# Patient Record
Sex: Female | Born: 1990 | Race: White | Hispanic: No | Marital: Married | Smoking: Former smoker
Health system: Southern US, Community
[De-identification: ages and names within clinical notes are randomized; demographics above are authoritative.]

## PROBLEM LIST (undated history)

## (undated) DIAGNOSIS — E282 Polycystic ovarian syndrome: Secondary | ICD-10-CM

## (undated) DIAGNOSIS — F1910 Other psychoactive substance abuse, uncomplicated: Secondary | ICD-10-CM

---

## 2015-02-19 ENCOUNTER — Encounter (HOSPITAL_COMMUNITY): Payer: Self-pay | Admitting: *Deleted

## 2015-02-19 ENCOUNTER — Emergency Department (HOSPITAL_COMMUNITY)
Admission: EM | Admit: 2015-02-19 | Discharge: 2015-02-20 | Disposition: A | Discharge status: Home / Self Care | Payer: BLUE CROSS/BLUE SHIELD | Attending: Emergency Medicine | Admitting: Emergency Medicine

## 2015-02-19 DIAGNOSIS — Y939 Activity, unspecified: Secondary | ICD-10-CM | POA: Diagnosis not present

## 2015-02-19 DIAGNOSIS — T7840XA Allergy, unspecified, initial encounter: Secondary | ICD-10-CM

## 2015-02-19 DIAGNOSIS — X58XXXA Exposure to other specified factors, initial encounter: Secondary | ICD-10-CM | POA: Insufficient documentation

## 2015-02-19 DIAGNOSIS — Y999 Unspecified external cause status: Secondary | ICD-10-CM | POA: Diagnosis not present

## 2015-02-19 DIAGNOSIS — Z8742 Personal history of other diseases of the female genital tract: Secondary | ICD-10-CM | POA: Insufficient documentation

## 2015-02-19 DIAGNOSIS — Y929 Unspecified place or not applicable: Secondary | ICD-10-CM | POA: Diagnosis not present

## 2015-02-19 DIAGNOSIS — Z720 Tobacco use: Secondary | ICD-10-CM | POA: Diagnosis not present

## 2015-02-19 DIAGNOSIS — Z79899 Other long term (current) drug therapy: Secondary | ICD-10-CM | POA: Insufficient documentation

## 2015-02-19 HISTORY — DX: Polycystic ovarian syndrome: E28.2

## 2015-02-19 HISTORY — DX: Other psychoactive substance abuse, uncomplicated: F19.10

## 2015-02-19 MED ORDER — METHYLPREDNISOLONE SODIUM SUCC 125 MG IJ SOLR
125.0000 mg | Freq: Once | INTRAMUSCULAR | Status: AC
  Administered 2015-02-19: 125 mg via INTRAVENOUS
  Filled 2015-02-19: qty 2

## 2015-02-19 MED ORDER — SODIUM CHLORIDE 0.9 % IV SOLN
INTRAVENOUS | Status: DC
  Administered 2015-02-19: via INTRAVENOUS

## 2015-02-19 MED ORDER — DIPHENHYDRAMINE HCL 50 MG/ML IJ SOLN
25.0000 mg | Freq: Once | INTRAMUSCULAR | Status: AC
  Administered 2015-02-19: 25 mg via INTRAVENOUS
  Filled 2015-02-19: qty 1

## 2015-02-19 MED ORDER — FAMOTIDINE IN NACL 20-0.9 MG/50ML-% IV SOLN
INTRAVENOUS | Status: AC
  Administered 2015-02-19: 20 mg
  Filled 2015-02-19: qty 50

## 2015-02-19 NOTE — ED Notes (Signed)
Pt is a patient at [facility]; pt states that she got sunburnt on 7/4 to her lower legs and that they have been red and itching and peeling; pt states that yesterday she began to have hives and itching to thighs, trunks, arms and neck; pt has been taking Vistaril, Hydrocortisone cream and Pepcid at [facility]; pt was started on Cipro today

## 2015-02-19 NOTE — ED Provider Notes (Signed)
History  This chart was scribed for non-physician practitioner, Francee Piccolo, PA-C,working with Alvira Monday, MD, by Karle Plumber, ED Scribe. This patient was seen in room WTR6/WTR6 and the patient's care was started at 11:00 PM.  Chief Complaint  Patient presents with  . Urticaria   The history is provided by the patient and medical records. No language interpreter was used.    HPI Comments:  Arleth Mccullar is a 24 y.o. female from [place] who presents to the Emergency Department complaining of a rash to the BLE, BUE, chest and hands that began last night. She states she got sunburned two weeks ago and now reports redness, itching and peeling of the areas. Pt also reports one episode of emesis last night. She states she began taking Vistaril for itching associated with the sunburn for the past 5 days. She has been taking Benadryl 50 mg (about 5 hours ago) and applying hydrocortisone cream for her symptoms with minimal relief. She has also taken Ibuprofen 600 mg. Pt was started on Cipro earlier today for treatment of a UTI. Pt denies any new creams or lotions but states the detergent that her clothes have been washed in is whatever [facility] uses. She denies modifying factors. She denies inability to swallow, SOB, CP, swelling of the tongue or lips, nausea, fever or chills.   Past Medical History  Diagnosis Date  . PCOS (polycystic ovarian syndrome)   . Polysubstance abuse    History reviewed. No pertinent past surgical history. No family history on file. History  Substance Use Topics  . Smoking status: Current Some Day Smoker  . Smokeless tobacco: Not on file  . Alcohol Use: No   OB History    No data available     Review of Systems  Constitutional: Negative for fever and chills.  HENT: Negative for trouble swallowing.   Respiratory: Negative for shortness of breath.   Cardiovascular: Negative for chest pain.  Gastrointestinal: Positive for vomiting  (one episode yesterday). Negative for nausea.  Skin: Positive for color change, rash and wound.  All other systems reviewed and are negative.   Allergies  Macrolides and ketolides  Home Medications   Prior to Admission medications   Medication Sig Start Date End Date Taking? Authorizing Provider  albuterol (PROVENTIL HFA;VENTOLIN HFA) 108 (90 BASE) MCG/ACT inhaler Inhale 2 puffs into the lungs every 6 (six) hours as needed for wheezing or shortness of breath.   Yes Historical Provider, MD  buPROPion (WELLBUTRIN XL) 300 MG 24 hr tablet Take 300 mg by mouth daily. 01/21/15  Yes Historical Provider, MD  Calcium & Magnesium Carbonates (MYLANTA PO) Take 10-20 mLs by mouth as needed (indigestion).   Yes Historical Provider, MD  cetirizine (ZYRTEC) 10 MG tablet Take 10 mg by mouth daily.   Yes Historical Provider, MD  ciprofloxacin (CIPRO) 500 MG tablet Take 500 mg by mouth 2 (two) times daily. For 5 days 02/19/15 02/23/15 Yes Historical Provider, MD  diazepam (VALIUM) 10 MG tablet Take 10 mg by mouth as needed (incase of seizure).   Yes Historical Provider, MD  diphenhydrAMINE (BENADRYL) 25 MG tablet Take 25-50 mg by mouth every 6 (six) hours as needed for allergies.   Yes Historical Provider, MD  hydrocortisone cream 1 % Apply 1 application topically as needed for itching.   Yes Historical Provider, MD  hydrOXYzine (VISTARIL) 25 MG capsule Take 25 mg by mouth 3 (three) times daily as needed for anxiety or itching.   Yes Historical Provider, MD  ibuprofen (ADVIL,MOTRIN) 600 MG tablet Take 600 mg by mouth every 6 (six) hours as needed for moderate pain.   Yes Historical Provider, MD  levothyroxine (SYNTHROID, LEVOTHROID) 50 MCG tablet Take 50 mcg by mouth daily. 01/21/15  Yes Historical Provider, MD  metFORMIN (GLUCOPHAGE-XR) 500 MG 24 hr tablet Take 2,000 mg by mouth every morning. 01/21/15  Yes Historical Provider, MD  Multiple Vitamin (MULTIVITAMIN WITH MINERALS) TABS tablet Take 1 tablet by mouth  daily.   Yes Historical Provider, MD  naltrexone (DEPADE) 50 MG tablet Take 50 mg by mouth daily.   Yes Historical Provider, MD  omeprazole (PRILOSEC) 20 MG capsule Take 20 mg by mouth daily.   Yes Historical Provider, MD  ondansetron (ZOFRAN) 8 MG tablet Take 8 mg by mouth 2 (two) times daily as needed for nausea or vomiting.   Yes Historical Provider, MD  sertraline (ZOLOFT) 50 MG tablet Take 50 mg by mouth daily. 01/21/15  Yes Historical Provider, MD  thiamine (B-1) 100 MG/ML injection Inject 100 mg into the vein once.   Yes Historical Provider, MD  thiamine (VITAMIN B-1) 100 MG tablet Take 100 mg by mouth daily.   Yes Historical Provider, MD   Triage Vitals: BP 142/87 mmHg  Pulse 96  Temp(Src) 99.1 F (37.3 C) (Oral)  Resp 20  SpO2 100%  LMP 01/22/2015 Physical Exam  Constitutional: She is oriented to person, place, and time. She appears well-developed and well-nourished. No distress.  HENT:  Head: Normocephalic and atraumatic.  Right Ear: External ear normal.  Left Ear: External ear normal.  Nose: Nose normal.  Mouth/Throat: Oropharynx is clear and moist.  No intraoral swelling. No angioedema.no intraoral skin sloughing.  Eyes: Conjunctivae are normal.  Neck: Normal range of motion. Neck supple.  No nuchal rigidity.   Cardiovascular: Normal rate, regular rhythm and normal heart sounds.   Pulmonary/Chest: Effort normal.  Abdominal: Soft.  Musculoskeletal: Normal range of motion.  Neurological: She is alert and oriented to person, place, and time.  Skin: Skin is warm and dry. Rash (urticaria noted to BLE, BUE, chest, trunk, scalp) noted. She is not diaphoretic. There is erythema (erythema with dry skin noted lower extremities bilterally, distinct line noted consistent with short tan line from sunburn).  No skin sloughing  Psychiatric: She has a normal mood and affect.  Nursing note and vitals reviewed.   ED Course  Procedures (including critical care time) Medications   diphenhydrAMINE (BENADRYL) injection 25 mg (not administered)  methylPREDNISolone sodium succinate (SOLU-MEDROL) 125 mg/2 mL injection 125 mg (not administered)  famotidine (PEPCID) 20-0.9 MG/50ML-% IVPB (not administered)    DIAGNOSTIC STUDIES: Oxygen Saturation is 100% on RA, normal by my interpretation.   COORDINATION OF CARE: 11:05 PM- Will order IV Pepcid, Benadryl and steroids. Pt verbalizes understanding and agrees to plan.  Medications - No data to display  Labs Review Labs Reviewed - No data to display  Imaging Review No results found.   EKG Interpretation None      12:40 AM On re-evaluation rash improving, palmar surface of hands significantly improved, erythema resolving. No symptoms of SOB, wheezing, vomiting, abdominal pain, diarrhea, throat tightness, angioedema note. Patient is upset stating "I still don't feel any better."   MDM   Final diagnoses:  None    Filed Vitals:   02/20/15 0054  BP: 138/77  Pulse: 91  Temp:   Resp: 18   Afebrile, NAD, non-toxic appearing, AAOx4.   No evidence of SJS or necrotizing fasciitis. Due to pruritic  and not painful nature of blisters do not suspect pemphigus vulgaris. Pustules do not resemble scabies. Low suspicion for DRES or SSSS. No pustules, no warmth, no draining sinus tracts, no superficial abscesses, no bullous impetigo, no vesicles, no desquamation, no target lesions with dusky purpura or a central bulla.   Patient re-evaluated prior to dc, is hemodynamically stable, in no respiratory distress, and denies the feeling of throat closing. Pt has been advised to take OTC benadryl & return to the ED if they have a mod-severe allergic rxn (s/s including throat closing, difficulty breathing, swelling of lips face or tongue). Pt is to follow up with their PCP. Pt is agreeable with plan & verbalizes understanding.   Patient d/w with Dr. Dalene Seltzer, agrees with plan.   I personally performed the services described in this  documentation, which was scribed in my presence. The recorded information has been reviewed and is accurate.    Francee Piccolo, PA-C 02/20/15 0302  Alvira Monday, MD 02/20/15 [phone]

## 2015-02-20 MED ORDER — FAMOTIDINE 20 MG PO TABS: 20.0000 mg | ORAL_TABLET | Freq: Two times a day (BID) | ORAL | Status: AC

## 2015-02-20 MED ORDER — HYDROCORTISONE 2.5 % EX LOTN: TOPICAL_LOTION | Freq: Two times a day (BID) | CUTANEOUS | Status: AC

## 2015-02-20 MED ORDER — HYDROCORTISONE 1 % EX CREA
TOPICAL_CREAM | Freq: Four times a day (QID) | CUTANEOUS | Status: DC
  Filled 2015-02-20: qty 28

## 2015-02-20 MED ORDER — DIAZEPAM 5 MG PO TABS
5.0000 mg | ORAL_TABLET | Freq: Once | ORAL | Status: AC
  Administered 2015-02-20: 5 mg via ORAL
  Filled 2015-02-20: qty 1

## 2015-02-20 MED ORDER — PREDNISONE 20 MG PO TABS: 40.0000 mg | ORAL_TABLET | Freq: Every day | ORAL | Status: AC

## 2015-02-20 MED ORDER — DIPHENHYDRAMINE HCL 25 MG PO TABS: 25.0000 mg | ORAL_TABLET | Freq: Four times a day (QID) | ORAL | Status: AC | PRN

## 2015-02-20 NOTE — ED Notes (Signed)
Called [facility] for transport, spoke to Ty TyArlene.

## 2015-02-20 NOTE — Discharge Instructions (Signed)
Please follow up with your primary care physician in 1-2 days. If you do not have one please call the [facility] number listed above. Please read all discharge instructions and return precautions.   Hives Hives are itchy, red, swollen areas of the skin. They can vary in size and location on your body. Hives can come and go for hours or several days (acute hives) or for several weeks (chronic hives). Hives do not spread from person to person (noncontagious). They may get worse with scratching, exercise, and emotional stress. CAUSES   Allergic reaction to food, additives, or drugs.  Infections, including the common cold.  Illness, such as vasculitis, lupus, or thyroid disease.  Exposure to sunlight, heat, or cold.  Exercise.  Stress.  Contact with chemicals. SYMPTOMS   Red or white swollen patches on the skin. The patches may change size, shape, and location quickly and repeatedly.  Itching.  Swelling of the hands, feet, and face. This may occur if hives develop deeper in the skin. DIAGNOSIS  Your caregiver can usually tell what is wrong by performing a physical exam. Skin or blood tests may also be done to determine the cause of your hives. In some cases, the cause cannot be determined. TREATMENT  Mild cases usually get better with medicines such as antihistamines. Severe cases may require an emergency epinephrine injection. If the cause of your hives is known, treatment includes avoiding that trigger.  HOME CARE INSTRUCTIONS   Avoid causes that trigger your hives.  Take antihistamines as directed by your caregiver to reduce the severity of your hives. Non-sedating or low-sedating antihistamines are usually recommended. Do not drive while taking an antihistamine.  Take any other medicines prescribed for itching as directed by your caregiver.  Wear loose-fitting clothing.  Keep all follow-up appointments as directed by your caregiver. SEEK MEDICAL CARE IF:     You have persistent or severe itching that is not relieved with medicine.  You have painful or swollen joints. SEEK IMMEDIATE MEDICAL CARE IF:   You have a fever.  Your tongue or lips are swollen.  You have trouble breathing or swallowing.  You feel tightness in the throat or chest.  You have abdominal pain. These problems may be the first sign of a life-threatening allergic reaction. Call your local emergency services (911 in [place] MAKE SURE YOU:   Understand these instructions.  Will watch your condition.  Will get help right away if you are not doing well or get worse. Document Released: 07/21/2005 Document Revised: 07/26/2013 Document Reviewed: 10/14/2011 [facility] Patient Information 2015 [employer] [place]. This information is not intended to replace advice given to you by your health care provider. Make sure you discuss any questions you have with your health care provider.

## 2019-05-26 ENCOUNTER — Other Ambulatory Visit: Payer: Self-pay | Admitting: Surgery

## 2019-05-26 ENCOUNTER — Other Ambulatory Visit (HOSPITAL_COMMUNITY): Payer: Self-pay | Admitting: Surgery

## 2019-05-26 DIAGNOSIS — K2100 Gastro-esophageal reflux disease with esophagitis, without bleeding: Secondary | ICD-10-CM

## 2019-06-06 ENCOUNTER — Other Ambulatory Visit: Payer: Self-pay

## 2019-06-06 ENCOUNTER — Ambulatory Visit (HOSPITAL_COMMUNITY)
Admission: RE | Admit: 2019-06-06 | Discharge: 2019-06-06 | Disposition: A | Discharge status: Home / Self Care | Payer: BC Managed Care – PPO | Source: Ambulatory Visit | Attending: Surgery | Admitting: Surgery

## 2019-06-06 ENCOUNTER — Other Ambulatory Visit: Payer: Self-pay | Admitting: Surgery

## 2019-06-06 DIAGNOSIS — K2100 Gastro-esophageal reflux disease with esophagitis, without bleeding: Secondary | ICD-10-CM | POA: Insufficient documentation

## 2019-06-13 ENCOUNTER — Other Ambulatory Visit: Payer: Self-pay

## 2019-06-13 ENCOUNTER — Encounter: Payer: Self-pay | Admitting: Skilled Nursing Facility1

## 2019-06-13 ENCOUNTER — Encounter: Payer: BC Managed Care – PPO | Attending: Surgery | Admitting: Skilled Nursing Facility1

## 2019-06-13 DIAGNOSIS — E669 Obesity, unspecified: Secondary | ICD-10-CM | POA: Diagnosis present

## 2019-06-13 NOTE — Progress Notes (Signed)
Nutrition Assessment for Bariatric Surgery Medical Nutrition Therapy  Patient was seen on 06/13/2019 for Pre-Operative Nutrition Assessment. Letter of approval faxed to [facility] bariatric surgery program coordinator on 06/13/2019.   Referral stated Supervised Weight Loss (SWL) visits needed: 4 (can be any time apart with proposed date being January/february) not including pre-op class  Planned surgery: RYGB Pt expectation of surgery: to lose weight Pt expectation of dietitian: none stated    NUTRITION ASSESSMENT   Anthropometrics  Start weight at [facility]: 301.1 lbs (date: 06/13/2019)  Height: 64 in BMI: 51.68 kg/m2     Clinical  Medical hx: recurrent blood clots, anxiety/depression, ETOH  Medications: see list Labs:  Notable signs/symptoms: fatigue   Lifestyle & Dietary Hx (living situation, sleep regimen, functional ability, weight hx, current dietary patterns, fluid intake, supplements, physical activity, etc)  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  Pt states she has been sober for about 2 years and still works with a therapist and has a supportive wife.  Pt states she feels she needs to be tested for narcolepsy.    24-Hr Dietary Recall First Meal: fast food Snack:  Second Meal: fast food Snack:  Third Meal: fast food Snack: pudding Beverages: water, diet soda, diet tea, energy    Estimated Energy Needs Calories: 1600 Carbohydrate: 180g Protein: 120g Fat: 44g   NUTRITION DIAGNOSIS  Overweight/obesity ([place]-3.3) related to past poor dietary habits and physical inactivity as evidenced by patient w/ planned RYGB surgery following dietary guidelines for continued weight loss.    NUTRITION INTERVENTION  Nutrition counseling (C-1) and education (E-2) to facilitate bariatric surgery goals.   Pre-Op Goals Reviewed with the Patient . Track food and beverage intake (pen and paper, MyFitness Pal, Baritastic app, etc.): 3 days  each week at least 2 weekend days  . Make healthy food choices while monitoring portion sizes . Consume 3 meals per day or try to eat every 3-5 hours . Avoid concentrated sugars and fried foods . Keep sugar & fat in the single digits per serving on food labels . Practice CHEWING your food (aim for applesauce consistency) . Practice not drinking 15 minutes before, during, and 30 minutes after each meal and snack . Avoid all carbonated beverages (ex: soda, sparkling beverages)  . Limit caffeinated beverages (ex: coffee, tea, energy drinks) . Avoid all sugar-sweetened beverages (ex: regular soda, sports drinks)  . Avoid alcohol  . Aim for 64-100 ounces of FLUID daily (with at least half of fluid intake being plain water)  . Aim for at least 60-80 grams of PROTEIN daily . Look for a liquid protein source that contains ?15 g protein and ?5 g carbohydrate (ex: shakes, drinks, shots) . Make a list of non-food related activities . Physical activity is an important part of a healthy lifestyle so keep it moving! The goal is to reach 150 minutes of exercise per week, including cardiovascular and weight baring activity.  *Goals that are bolded indicate the pt would like to start working towards these  Handouts Provided Include  . Bariatric Surgery handouts (Nutrition Visits, Pre-Op Goals, Protein Shakes, Vitamins & Minerals)  Learning Style & Readiness for Change Teaching method utilized: Visual & Auditory  Demonstrated degree of understanding via: Teach Back  Barriers to learning/adherence to lifestyle change: none identified   RD's Notes for Next Visit . Review food log . help pt choose next goal     MONITORING & EVALUATION Dietary intake, weekly physical activity,  body weight, and pre-op goals reached at next nutrition visit.    Next Steps  Patient is to call [facility] to enroll in Pre-Op Class (>2 weeks before surgery) and Post-Op Class (2 weeks after surgery) for further nutrition education  when surgery date is scheduled.

## 2019-06-27 ENCOUNTER — Ambulatory Visit: Payer: BC Managed Care – PPO | Admitting: Skilled Nursing Facility1

## 2019-07-04 ENCOUNTER — Other Ambulatory Visit: Payer: Self-pay

## 2019-07-04 ENCOUNTER — Encounter: Payer: BC Managed Care – PPO | Admitting: Dietician

## 2019-07-04 ENCOUNTER — Encounter: Payer: Self-pay | Admitting: Dietician

## 2019-07-04 DIAGNOSIS — E669 Obesity, unspecified: Secondary | ICD-10-CM | POA: Diagnosis not present

## 2019-07-04 NOTE — Progress Notes (Signed)
Supervised Weight Loss Visit Bariatric Nutrition Education Appt Start Time: 3:45pm    End Time: 4:00pm  Planned Surgery: RYGB  Pt Expectation of Surgery/ Goals: to lose weight   1st out of 4 SWL Appointments  (do not need to be 30 days apart; anticipated surgery date Jan/Feb 2021)   NUTRITION ASSESSMENT  Anthropometrics  Start weight at [facility]: 301.1 lbs (date: 06/13/2019) Today's weight: 305 lbs Weight change: +4 lbs (since previous visit on 06/13/2019) BMI: 52.4 kg/m2    Lifestyle & Dietary Hx States she and her partner just moved over the weekend. Typical food intake is fast food. States she may not eat 3 meals a day, but since she started tracking her food intake over the past month she realized she is eating a lot of calories at mealtimes. States that tracking her food causes anxiety. Been working on not drinking with meals.   24-Hr Dietary Recall First Meal: [employer]'s egg McMuffin Snack: - Second Meal: 3pm: [place] (or fast food)  Snack: - Third Meal: take out  Snack: - Beverages: energy drinks, diet soda, sweet tea  Estimated Energy Needs Calories: 1600 Carbohydrate: 180g Protein: 120g Fat: 44g   NUTRITION DIAGNOSIS  Overweight/obesity ([place]-3.3) related to past poor dietary habits and physical inactivity as evidenced by patient w/ planned RYGB surgery following dietary guidelines for continued weight loss.   NUTRITION INTERVENTION  Nutrition counseling (C-1) and education (E-2) to facilitate bariatric surgery goals.  Pre-Op Goals Progress & New Goals . Tracking food intake  . Working on not drinking with meals   Learning Style & Readiness for Change Teaching method utilized: Environmental health practitioner & Auditory  Demonstrated degree of understanding via: Teach Back  Barriers to learning/adherence to lifestyle change: None Identified   RD's Notes for next Visit  . Physical Activity    MONITORING & EVALUATION Dietary intake, weekly physical activity, body weight, and pre-op  goals in 2 weeks.   Next Steps  Patient is to return to [facility] in 2 weeks for 2nd (of 4) SWL Visits.

## 2019-07-19 ENCOUNTER — Ambulatory Visit: Payer: BC Managed Care – PPO | Admitting: Dietician

## 2019-08-03 ENCOUNTER — Ambulatory Visit (INDEPENDENT_AMBULATORY_CARE_PROVIDER_SITE_OTHER): Payer: BC Managed Care – PPO | Admitting: Psychology

## 2019-08-03 DIAGNOSIS — F509 Eating disorder, unspecified: Secondary | ICD-10-CM

## 2019-08-18 ENCOUNTER — Ambulatory Visit: Payer: BC Managed Care – PPO | Admitting: Psychology

## 2019-09-21 ENCOUNTER — Ambulatory Visit (INDEPENDENT_AMBULATORY_CARE_PROVIDER_SITE_OTHER): Payer: BC Managed Care – PPO | Admitting: Psychology

## 2019-09-21 DIAGNOSIS — F509 Eating disorder, unspecified: Secondary | ICD-10-CM

## 2019-09-26 ENCOUNTER — Other Ambulatory Visit: Payer: Self-pay

## 2019-09-26 ENCOUNTER — Encounter: Payer: BC Managed Care – PPO | Attending: Surgery | Admitting: Skilled Nursing Facility1

## 2019-09-26 DIAGNOSIS — E669 Obesity, unspecified: Secondary | ICD-10-CM | POA: Insufficient documentation

## 2019-09-26 NOTE — Progress Notes (Signed)
Pre-Operative Nutrition Class:  Appt start time: 3354   End time:  1830.  Patient was seen on 09/26/19 for Pre-Operative Bariatric Surgery [facility]   Surgery date:  Surgery type: RYGB Start weight at [facility]: 301.1 Weight today: 313.8  Samples given per MNT protocol. Patient educated on appropriate usage: Bariatric Advantage Multivitamin Lot # [id number] Exp:08/21  Bariatric Advantage Calcium  Lot # [id number] Exp: 08/06/2020   Protein Shake Lot # [id number] Exp: 12/19/2020  The following the learning objectives were met by the patient during this course:  Identify Pre-Op Dietary Goals and will begin 2 weeks pre-operatively  Identify appropriate sources of fluids and proteins   State protein recommendations and appropriate sources pre and post-operatively  Identify Post-Operative Dietary Goals and will follow for 2 weeks post-operatively  Identify appropriate multivitamin and calcium sources  Describe the need for physical activity post-operatively and will follow MD recommendations  State when to call healthcare provider regarding medication questions or post-operative complications  Handouts given during class include:  Pre-Op Bariatric Surgery Diet Handout  Protein Shake Handout  Post-Op Bariatric Surgery Nutrition Handout  [facility] Program Information Flyer  Support Group Information Flyer  [facility] Bariatric Supplements Price List  Follow-Up Plan: Patient will follow-up at [facility] 2 weeks post operatively for diet advancement per MD.

## 2019-11-01 ENCOUNTER — Ambulatory Visit (INDEPENDENT_AMBULATORY_CARE_PROVIDER_SITE_OTHER): Payer: BC Managed Care – PPO | Admitting: Psychology

## 2019-11-01 ENCOUNTER — Ambulatory Visit: Payer: Self-pay | Admitting: Psychology

## 2019-11-01 DIAGNOSIS — F509 Eating disorder, unspecified: Secondary | ICD-10-CM

## 2020-11-15 ENCOUNTER — Ambulatory Visit
Admission: RE | Admit: 2020-11-15 | Discharge: 2020-11-15 | Disposition: A | Discharge status: Home / Self Care | Payer: BC Managed Care – PPO | Source: Ambulatory Visit | Attending: Internal Medicine | Admitting: Internal Medicine

## 2020-11-15 ENCOUNTER — Other Ambulatory Visit: Payer: Self-pay

## 2020-11-15 VITALS — BP 153/106 | HR 98 | Temp 98.8°F | Resp 19

## 2020-11-15 DIAGNOSIS — N3001 Acute cystitis with hematuria: Secondary | ICD-10-CM | POA: Insufficient documentation

## 2020-11-15 DIAGNOSIS — Z8742 Personal history of other diseases of the female genital tract: Secondary | ICD-10-CM

## 2020-11-15 DIAGNOSIS — N760 Acute vaginitis: Secondary | ICD-10-CM | POA: Insufficient documentation

## 2020-11-15 LAB — POCT URINALYSIS DIP (MANUAL ENTRY)
Bilirubin, UA: NEGATIVE
Blood, UA: NEGATIVE
Glucose, UA: NEGATIVE mg/dL
Ketones, POC UA: NEGATIVE mg/dL
Nitrite, UA: NEGATIVE
Protein Ur, POC: NEGATIVE mg/dL
Spec Grav, UA: 1.025 (ref 1.010–1.025)
Urobilinogen, UA: 0.2 E.U./dL
pH, UA: 7 (ref 5.0–8.0)

## 2020-11-15 MED ORDER — NITROFURANTOIN MONOHYD MACRO 100 MG PO CAPS: 100.0000 mg | ORAL_CAPSULE | Freq: Two times a day (BID) | ORAL | 0 refills | Status: AC

## 2020-11-15 MED ORDER — VALACYCLOVIR HCL 1 G PO TABS: 1000.0000 mg | ORAL_TABLET | Freq: Three times a day (TID) | ORAL | 0 refills | Status: AC

## 2020-11-15 MED ORDER — LIDOCAINE VISCOUS HCL 2 % MT SOLN: OROMUCOSAL | 0 refills | Status: AC

## 2020-11-15 NOTE — Discharge Instructions (Addendum)
Sign up for mychart to see your results. We always call you if positive

## 2020-11-15 NOTE — ED Triage Notes (Addendum)
Pt reports she has vaginal yeast infection that will not go away with otc meds.  Having vaginal Burning, discharge , itching and pain x 5 days .  Reports some burning with urination.

## 2020-11-15 NOTE — ED Provider Notes (Signed)
[facility]    CSN: [id number] Arrival date & time: 11/15/20  1455      History   Chief Complaint Chief Complaint  Patient presents with  . Vaginitis    HPI Rachel Melendez is a 30 y.o. female who presents with recurring vaginal itching, burning and discharge x 5 days. She also has dysuria. Has been using OTC yeast meds but has not helped. She has sores inside her vagina.  Is sexually active and he has burning. The vaginal discharge is her normal. She admits have had oral sex with her partner.   He is also complaining of burning on his penis, but she does not know if he has any lesions since she did not examined him. He is not circumcised.     Past Medical History:  Diagnosis Date  . PCOS (polycystic ovarian syndrome)   . Polysubstance abuse (HCC)     There are no problems to display for this patient.   History reviewed. No pertinent surgical history.  OB History   No obstetric history on file.      Home Medications    Prior to Admission medications   Medication Sig Start Date End Date Taking? Authorizing Provider  lidocaine (XYLOCAINE) 2 % solution Apply qith Qtip to painful lesions up to qid 11/15/20  Yes Rodriguez-Southworth, Nettie Elm, PA-C  nitrofurantoin, macrocrystal-monohydrate, (MACROBID) 100 MG capsule Take 1 capsule (100 mg total) by mouth 2 (two) times daily. 11/15/20  Yes Rodriguez-Southworth, Nettie Elm, PA-C  valACYclovir (VALTREX) 1000 MG tablet Take 1 tablet (1,000 mg total) by mouth 3 (three) times daily for 7 days. 11/15/20 11/22/20 Yes Rodriguez-Southworth, Nettie Elm, PA-C  albuterol (PROVENTIL HFA;VENTOLIN HFA) 108 (90 BASE) MCG/ACT inhaler Inhale 2 puffs into the lungs every 6 (six) hours as needed for wheezing or shortness of breath.    [provider]  buPROPion (WELLBUTRIN XL) 300 MG 24 hr tablet Take 300 mg by mouth daily. 01/21/15   [provider]  Calcium & Magnesium Carbonates (MYLANTA PO) Take 10-20 mLs by mouth as needed  (indigestion).    [provider]  cetirizine (ZYRTEC) 10 MG tablet Take 10 mg by mouth daily.    [provider]  diazepam (VALIUM) 10 MG tablet Take 10 mg by mouth as needed (incase of seizure).    [provider]  diphenhydrAMINE (BENADRYL) 25 MG tablet Take 25-50 mg by mouth every 6 (six) hours as needed for allergies.    [provider]  diphenhydrAMINE (BENADRYL) 25 MG tablet Take 1 tablet (25 mg total) by mouth every 6 (six) hours as needed for itching (Rash). 02/20/15   Piepenbrink, Victorino Dike, PA-C  famotidine (PEPCID) 20 MG tablet Take 1 tablet (20 mg total) by mouth 2 (two) times daily. 02/20/15   Piepenbrink, Victorino Dike, PA-C  hydrocortisone 2.5 % lotion Apply topically 2 (two) times daily. 02/20/15   Piepenbrink, Victorino Dike, PA-C  hydrocortisone cream 1 % Apply 1 application topically as needed for itching.    [provider]  ibuprofen (ADVIL,MOTRIN) 600 MG tablet Take 600 mg by mouth every 6 (six) hours as needed for moderate pain.    [provider]  levothyroxine (SYNTHROID, LEVOTHROID) 50 MCG tablet Take 50 mcg by mouth daily. 01/21/15   [provider]  metFORMIN (GLUCOPHAGE-XR) 500 MG 24 hr tablet Take 2,000 mg by mouth every morning. 01/21/15   [provider]  Multiple Vitamin (MULTIVITAMIN WITH MINERALS) TABS tablet Take 1 tablet by mouth daily.    [provider]  naltrexone (DEPADE) 50 MG tablet Take 50 mg by mouth daily.    [provider]  omeprazole (PRILOSEC) 20 MG capsule Take 20 mg by mouth daily.    [provider]  ondansetron (ZOFRAN) 8 MG tablet Take 8 mg by mouth 2 (two) times daily as needed for nausea or vomiting.    [provider]  predniSONE (DELTASONE) 20 MG tablet Take 2 tablets (40 mg total) by mouth daily. 02/20/15   Piepenbrink, Victorino Dike, PA-C  sertraline (ZOLOFT) 50 MG tablet Take 50 mg by mouth daily. 01/21/15   [provider]  thiamine (B-1) 100  MG/ML injection Inject 100 mg into the vein once.    [provider]  thiamine (VITAMIN B-1) 100 MG tablet Take 100 mg by mouth daily.    [provider]    Family History No family history on file.  Social History Social History   Tobacco Use  . Smoking status: Former Games developer  . Smokeless tobacco: Never Used  Substance Use Topics  . Alcohol use: Not Currently  . Drug use: Not Currently    Comment: "pills"     Allergies   Macrolides and ketolides   Review of Systems Review of Systems  Genitourinary: Positive for genital sores. Negative for dysuria, pelvic pain, vaginal bleeding and vaginal discharge.  Skin: Negative for rash.     Physical Exam Triage Vital Signs ED Triage Vitals  Enc Vitals Group     BP 11/15/20 1505 (!) 153/106     Pulse Rate 11/15/20 1505 98     Resp 11/15/20 1505 19     Temp 11/15/20 1505 98.8 F (37.1 C)     Temp Source 11/15/20 1505 Oral     SpO2 11/15/20 1505 98 %     Weight --      Height --      Head Circumference --      Peak Flow --      Pain Score 11/15/20 1503 0     Pain Loc --      Pain Edu? --      Excl. in GC? --    No data found.  Updated Vital Signs BP (!) 153/106 (BP Location: Right Arm)   Pulse 98   Temp 98.8 F (37.1 C) (Oral)   Resp 19   LMP 11/08/2020   SpO2 98%   Visual Acuity Right Eye Distance:   Left Eye Distance:   Bilateral Distance:    Right Eye Near:   Left Eye Near:    Bilateral Near:     Physical Exam Vitals and nursing note reviewed.  Constitutional:      General: She is not in acute distress.    Appearance: She is obese. She is not toxic-appearing.  HENT:     Head: Normocephalic.     Right Ear: External ear normal.     Left Ear: External ear normal.  Eyes:     General: No scleral icterus.    Conjunctiva/sclera: Conjunctivae normal.  Pulmonary:     Effort: Pulmonary effort is normal.  Genitourinary:    Comments: Inner R labia with open lesion, but no vesicles or red  border on the open skin area. Is very painful.  Musculoskeletal:        General: Normal range of motion.     Cervical back: Neck supple.  Skin:    General: Skin is warm and dry.     Findings: No rash.  Neurological:  Mental Status: She is alert and oriented to person, place, and time.     Gait: Gait normal.  Psychiatric:        Mood and Affect: Mood normal.        Behavior: Behavior normal.        Thought Content: Thought content normal.        Judgment: Judgment normal.      UC Treatments / Results  Labs (all labs ordered are listed, but only abnormal results are displayed) Labs Reviewed  POCT URINALYSIS DIP (MANUAL ENTRY) - Abnormal; Notable for the following components:      Result Value   Color, UA straw (*)    Clarity, UA cloudy (*)    Leukocytes, UA Small (1+) (*)    All other components within normal limits  URINE CULTURE  HSV CULTURE AND TYPING  CERVICOVAGINAL ANCILLARY ONLY    EKG   Radiology No results found.  Procedures Procedures (including critical care time)  Medications Ordered in UC Medications - No data to display  Initial Impression / Assessment and Plan / UC Course  I have reviewed the triage vital signs and the nursing notes. Vaginal lesions, possible herpes. I went ahead and started her on Valtrex as noted. I will have her try Xylocal topically for pain. I her urine was sent out for a culture and I placed her on Macrobid.    Final Clinical Impressions(s) / UC Diagnoses   Final diagnoses:  History of vaginal sores  Vaginitis and vulvovaginitis  Acute cystitis with hematuria     Discharge Instructions     Sign up for mychart to see your results. We always call you if positive     ED Prescriptions    Medication Sig Dispense Auth. Provider   nitrofurantoin, macrocrystal-monohydrate, (MACROBID) 100 MG capsule Take 1 capsule (100 mg total) by mouth 2 (two) times daily. 10 capsule Rodriguez-Southworth, Nettie Elm, PA-C   valACYclovir  (VALTREX) 1000 MG tablet Take 1 tablet (1,000 mg total) by mouth 3 (three) times daily for 7 days. 21 tablet Rodriguez-Southworth, Cynia Abruzzo, PA-C   lidocaine (XYLOCAINE) 2 % solution Apply qith Qtip to painful lesions up to qid 100 mL Rodriguez-Southworth, Nettie Elm, PA-C     PDMP not reviewed this encounter.   Garey Ham, Cordelia Poche 11/15/20 2054

## 2020-11-16 ENCOUNTER — Telehealth (HOSPITAL_COMMUNITY): Payer: Self-pay | Admitting: Emergency Medicine

## 2020-11-16 LAB — CERVICOVAGINAL ANCILLARY ONLY
Bacterial Vaginitis (gardnerella): NEGATIVE
Candida Glabrata: POSITIVE — AB
Candida Vaginitis: NEGATIVE
Chlamydia: NEGATIVE
Comment: NEGATIVE
Comment: NEGATIVE
Comment: NEGATIVE
Comment: NEGATIVE
Comment: NEGATIVE
Comment: NORMAL
Neisseria Gonorrhea: NEGATIVE
Trichomonas: NEGATIVE

## 2020-11-16 MED ORDER — FLUCONAZOLE 150 MG PO TABS: 150.0000 mg | ORAL_TABLET | Freq: Once | ORAL | 0 refills | Status: AC

## 2020-11-17 LAB — URINE CULTURE: Culture: 10000 — AB

## 2020-11-19 LAB — HSV CULTURE AND TYPING

## 2021-06-14 IMAGING — RF DG UGI W SINGLE CM
8 of 9 series · 12 of 24 positions shown · non-contrast
Comparison: None.

CLINICAL DATA: Preop gastric bypass

EXAM:
UPPER GI SERIES WITH KUB
TECHNIQUE: After obtaining a scout radiograph a routine upper GI series was
performed using thin barium.
FLUOROSCOPY TIME:  Fluoroscopy Time:  1 minutes 48 seconds
Radiation Exposure Index (if provided by the fluoroscopic device):
94.6
Number of Acquired Spot Images: 8

[Series 2: cp_standard · 0.51mm/px · 2 of 106 frames shown (1 of 7)]
[frame 16/106]
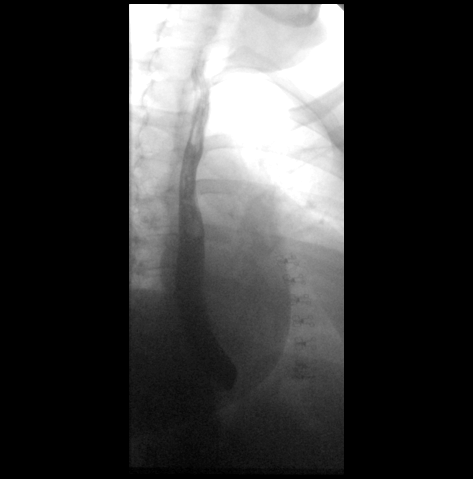
[frame 54/106]
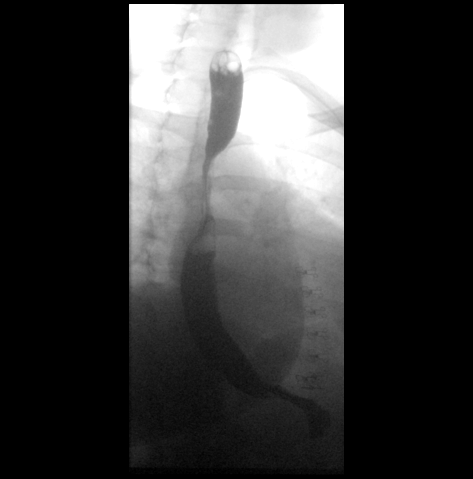

[Series 3: cp_standard · 0.34mm/px · 2 of 197 frames shown (2 of 7)]
[frame 30/197]
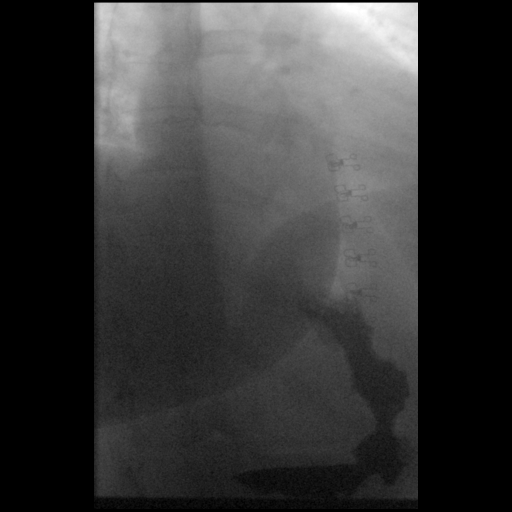
[frame 99/197]
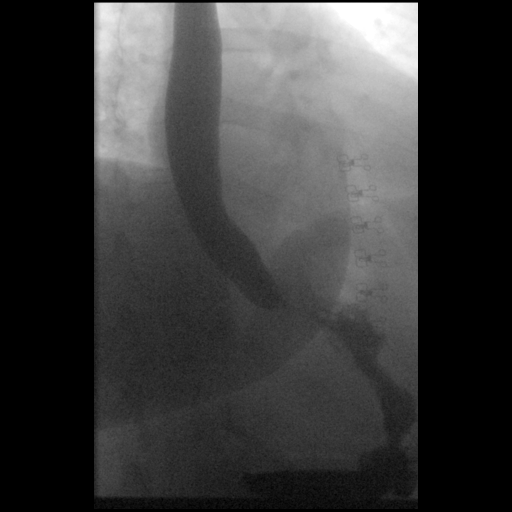

[Series 4: cp_standard · 0.17mm/px · 1 of 1 slices shown (3 of 7)]
[im 1/1]
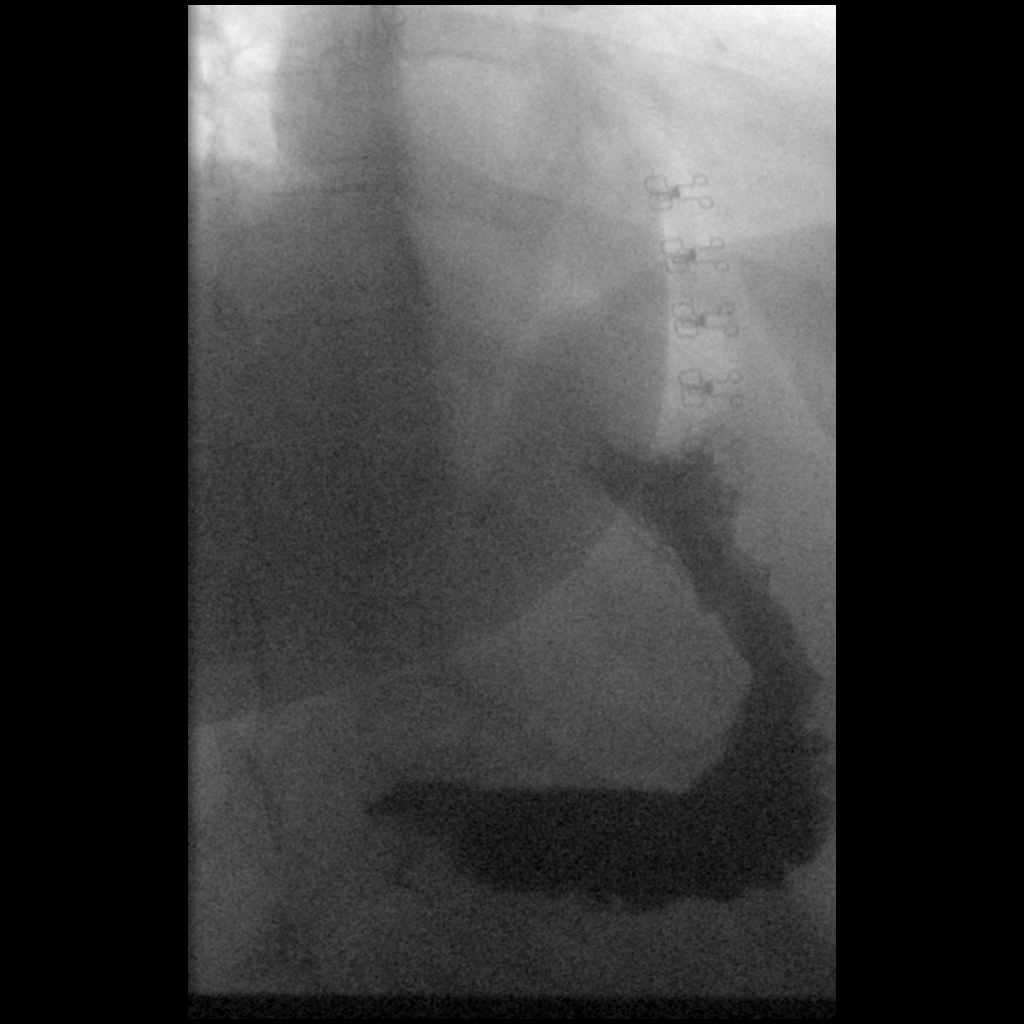

[Series 5: cp_standard · 0.34mm/px · 1 of 120 frames shown (4 of 7)]
[frame 22/120]
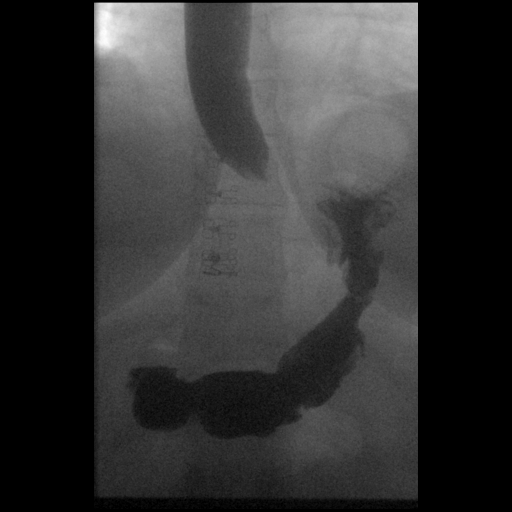

[Series 6: cp_standard · 0.17mm/px · 1 of 1 slices shown (5 of 7)]
[im 1/1]
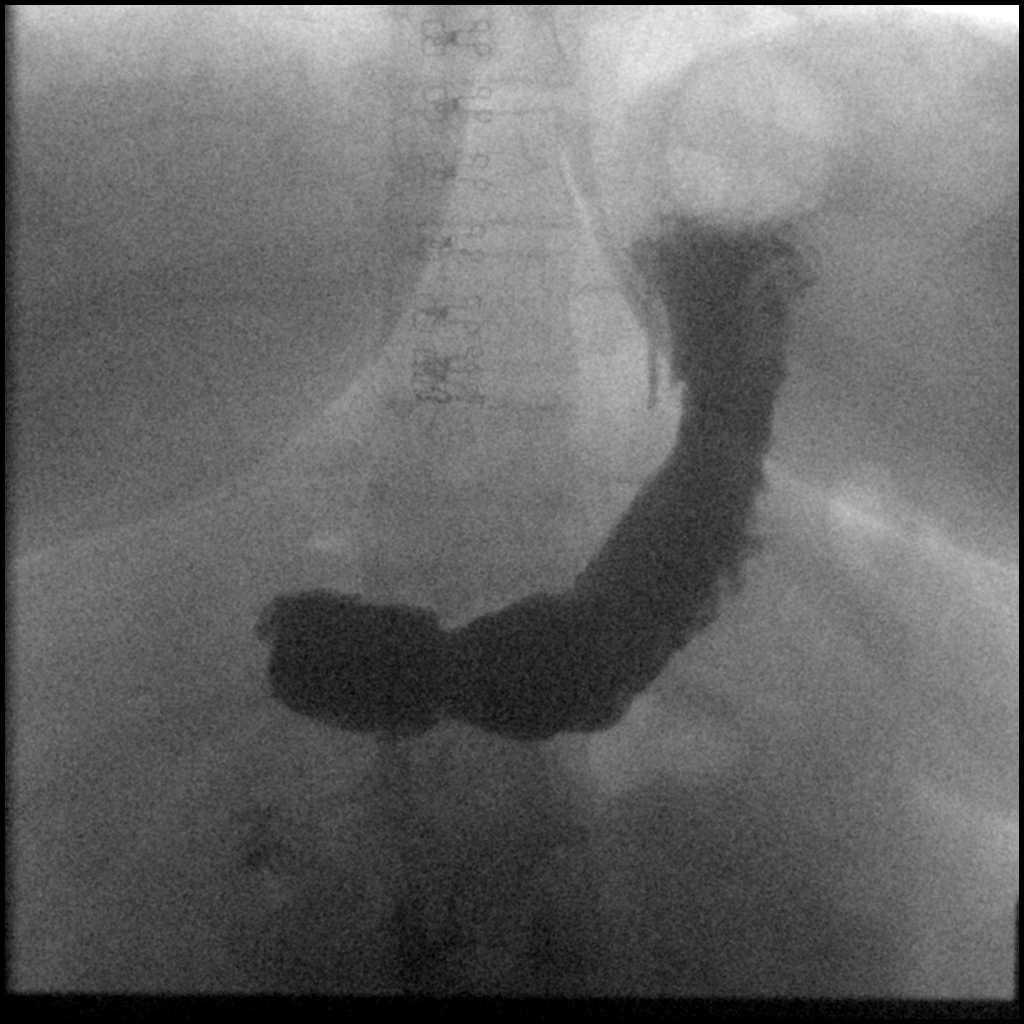

[Series 7: fluoro_barium 2fps_bw · 0.17mm/px · 1 of 2 frames shown]
[frame 2/2]
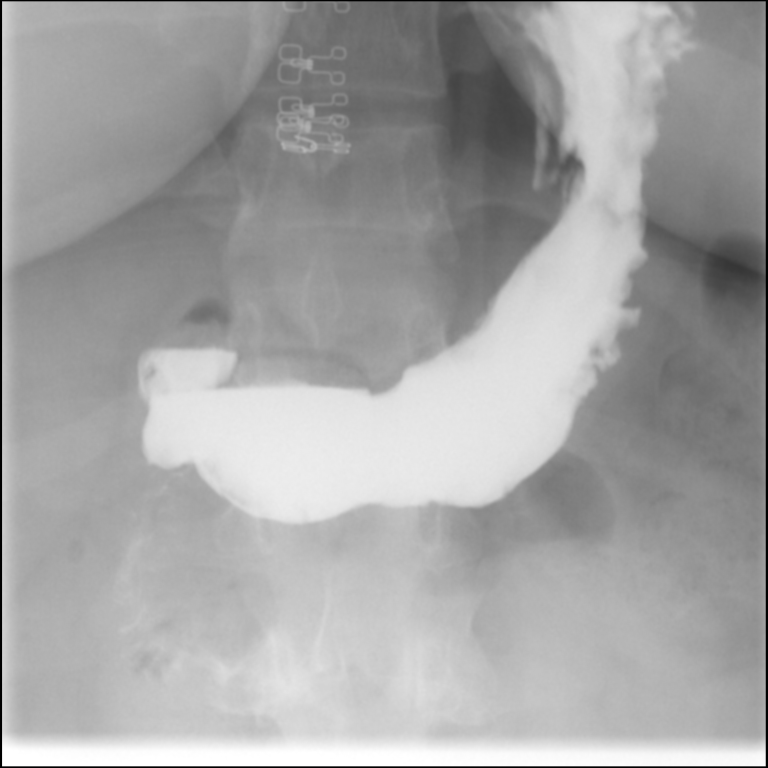

[Series 8: cp_standard · 0.51mm/px · 2 of 207 frames shown (6 of 7)]
[frame 104/207]
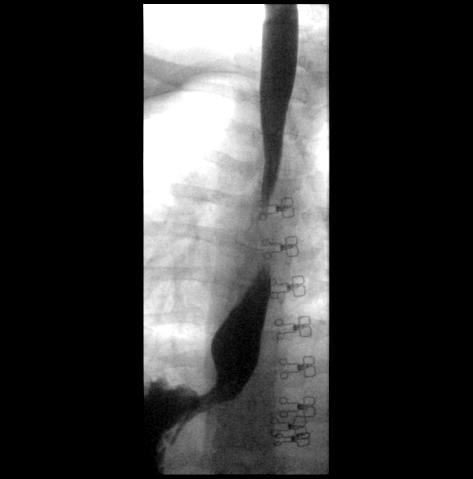
[frame 176/207]
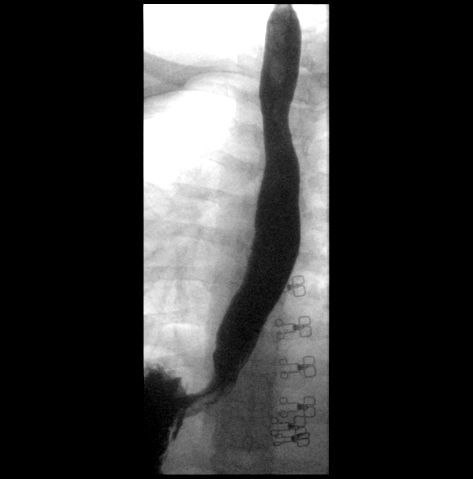

[Series 9: cp_standard · 0.51mm/px · 2 of 149 frames shown (7 of 7)]
[frame 75/149]
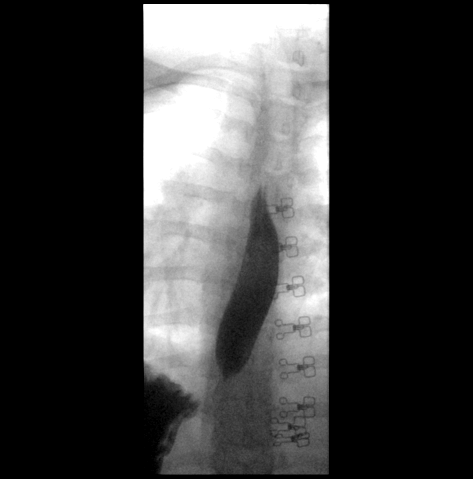
[frame 127/149]
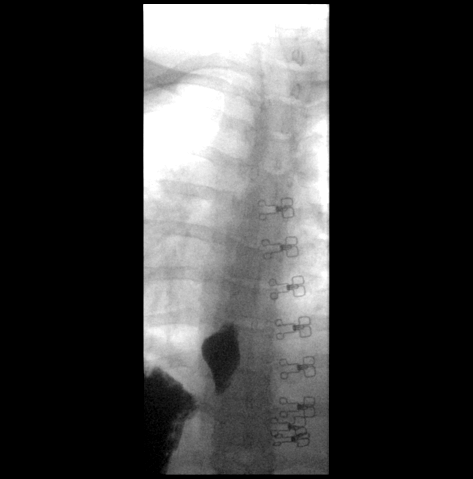

[12 of 24 positions shown; findings below may reference images not displayed]

FINDINGS: Normal esophageal peristalsis.

No fixed esophageal narrowing or stricture.

No evidence of hiatal hernia.

Normal gastric folds.

No evidence of gastroesophageal reflux.
IMPRESSION: Negative preop upper GI.
